# Patient Record
Sex: Male | Born: 1970 | Race: Black or African American | Hispanic: No | Marital: Married | Smoking: Current every day smoker
Health system: Southern US, Community
[De-identification: ages and names within clinical notes are randomized; demographics above are authoritative.]

---

## 2007-07-20 ENCOUNTER — Emergency Department: Payer: Self-pay | Admitting: Emergency Medicine

## 2016-03-25 ENCOUNTER — Emergency Department (HOSPITAL_COMMUNITY): Payer: Self-pay

## 2016-03-25 ENCOUNTER — Emergency Department (HOSPITAL_COMMUNITY)
Admission: EM | Admit: 2016-03-25 | Discharge: 2016-03-26 | Disposition: A | Discharge status: Home / Self Care | Payer: Self-pay | Source: Home / Self Care | Attending: Emergency Medicine | Admitting: Emergency Medicine

## 2016-03-25 ENCOUNTER — Emergency Department (HOSPITAL_COMMUNITY): Payer: Self-pay | Attending: Emergency Medicine

## 2016-03-25 ENCOUNTER — Encounter (HOSPITAL_COMMUNITY): Payer: Self-pay | Admitting: *Deleted

## 2016-03-25 DIAGNOSIS — Y929 Unspecified place or not applicable: Secondary | ICD-10-CM | POA: Insufficient documentation

## 2016-03-25 DIAGNOSIS — Y999 Unspecified external cause status: Secondary | ICD-10-CM

## 2016-03-25 DIAGNOSIS — T148XXA Other injury of unspecified body region, initial encounter: Secondary | ICD-10-CM

## 2016-03-25 DIAGNOSIS — R918 Other nonspecific abnormal finding of lung field: Secondary | ICD-10-CM

## 2016-03-25 DIAGNOSIS — S41012A Laceration without foreign body of left shoulder, initial encounter: Secondary | ICD-10-CM

## 2016-03-25 DIAGNOSIS — Z79899 Other long term (current) drug therapy: Secondary | ICD-10-CM

## 2016-03-25 DIAGNOSIS — Z5181 Encounter for therapeutic drug level monitoring: Secondary | ICD-10-CM | POA: Insufficient documentation

## 2016-03-25 DIAGNOSIS — Y939 Activity, unspecified: Secondary | ICD-10-CM | POA: Insufficient documentation

## 2016-03-25 LAB — I-STAT CHEM 8, ED
BUN: 9 mg/dL (ref 6–20)
CALCIUM ION: 1.08 mmol/L — AB (ref 1.15–1.40)
CHLORIDE: 105 mmol/L (ref 101–111)
CREATININE: 1.2 mg/dL (ref 0.61–1.24)
GLUCOSE: 92 mg/dL (ref 65–99)
HCT: 45 % (ref 39.0–52.0)
Hemoglobin: 15.3 g/dL (ref 13.0–17.0)
POTASSIUM: 4.1 mmol/L (ref 3.5–5.1)
Sodium: 138 mmol/L (ref 135–145)
TCO2: 20 mmol/L (ref 0–100)

## 2016-03-25 MED ORDER — FENTANYL CITRATE (PF) 100 MCG/2ML IJ SOLN
INTRAMUSCULAR | Status: AC
  Filled 2016-03-25: qty 2

## 2016-03-25 MED ORDER — IOPAMIDOL (ISOVUE-300) INJECTION 61%
75.0000 mL | Freq: Once | INTRAVENOUS | Status: AC | PRN
  Administered 2016-03-25: 75 mL via INTRAVENOUS

## 2016-03-25 MED ORDER — FENTANYL CITRATE (PF) 100 MCG/2ML IJ SOLN
INTRAMUSCULAR | Status: AC | PRN
  Administered 2016-03-25: 25 ug via INTRAVENOUS
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
## 2016-03-25 NOTE — ED Notes (Signed)
Patient transported to CT SCAN . 

## 2016-03-26 LAB — CBC WITH DIFFERENTIAL/PLATELET
BASOS ABS: 0 10*3/uL (ref 0.0–0.1)
Basophils Relative: 1 %
EOS ABS: 0.2 10*3/uL (ref 0.0–0.7)
Eosinophils Relative: 2 %
HCT: 42.3 % (ref 39.0–52.0)
HEMOGLOBIN: 14.6 g/dL (ref 13.0–17.0)
LYMPHS ABS: 3.4 10*3/uL (ref 0.7–4.0)
Lymphocytes Relative: 41 %
MCH: 31.3 pg (ref 26.0–34.0)
MCHC: 34.5 g/dL (ref 30.0–36.0)
MCV: 90.8 fL (ref 78.0–100.0)
Monocytes Absolute: 0.6 10*3/uL (ref 0.1–1.0)
Monocytes Relative: 8 %
NEUTROS PCT: 48 %
Neutro Abs: 4 10*3/uL (ref 1.7–7.7)
Platelets: 245 10*3/uL (ref 150–400)
RBC: 4.66 MIL/uL (ref 4.22–5.81)
RDW: 13.7 % (ref 11.5–15.5)
WBC: 8.2 10*3/uL (ref 4.0–10.5)

## 2016-03-26 LAB — PREPARE FRESH FROZEN PLASMA
UNIT DIVISION: 0
Unit division: 0

## 2016-03-26 LAB — PROTIME-INR
INR: 0.98
PROTHROMBIN TIME: 13 s (ref 11.4–15.2)

## 2016-03-26 LAB — TYPE AND SCREEN
ABO/RH(D): B POS
ANTIBODY SCREEN: NEGATIVE
UNIT DIVISION: 0
Unit division: 0

## 2016-03-26 LAB — BASIC METABOLIC PANEL
ANION GAP: 11 (ref 5–15)
BUN: 8 mg/dL (ref 6–20)
CHLORIDE: 104 mmol/L (ref 101–111)
CO2: 20 mmol/L — ABNORMAL LOW (ref 22–32)
Calcium: 9.1 mg/dL (ref 8.9–10.3)
Creatinine, Ser: 1.06 mg/dL (ref 0.61–1.24)
Glucose, Bld: 91 mg/dL (ref 65–99)
POTASSIUM: 4.1 mmol/L (ref 3.5–5.1)
SODIUM: 135 mmol/L (ref 135–145)

## 2016-03-26 LAB — ABO/RH: ABO/RH(D): B POS

## 2016-03-26 MED ORDER — LIDOCAINE-EPINEPHRINE (PF) 2 %-1:200000 IJ SOLN
20.0000 mL | Freq: Once | INTRAMUSCULAR | Status: AC
  Administered 2016-03-26: 20 mL
  Filled 2016-03-26: qty 20

## 2016-03-26 MED ORDER — FENTANYL CITRATE (PF) 100 MCG/2ML IJ SOLN
25.0000 ug | Freq: Once | INTRAMUSCULAR | Status: AC
Start: 2016-03-26 — End: 2016-03-26
  Administered 2016-03-26: 25 ug via INTRAVENOUS

## 2016-03-26 NOTE — Discharge Instructions (Signed)
Follow up for staple removal in 1 week. Return to the ED if you develop or worsening symptoms.

## 2016-03-26 NOTE — ED Notes (Signed)
Pt. ambulated without difficulty , no dizziness/respirations unlabored , pt. tolerated oral fluids ( Sprite) with no nausea or emesis .
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
## 2016-06-11 ENCOUNTER — Encounter: Payer: Self-pay | Admitting: Emergency Medicine

## 2016-06-11 ENCOUNTER — Emergency Department
Admission: EM | Admit: 2016-06-11 | Discharge: 2016-06-12 | Disposition: A | Discharge status: Home / Self Care | Payer: Self-pay | Attending: Student in an Organized Health Care Education/Training Program | Admitting: Student in an Organized Health Care Education/Training Program

## 2016-06-11 DIAGNOSIS — F17200 Nicotine dependence, unspecified, uncomplicated: Secondary | ICD-10-CM | POA: Insufficient documentation

## 2016-06-11 DIAGNOSIS — F1010 Alcohol abuse, uncomplicated: Secondary | ICD-10-CM

## 2016-06-11 DIAGNOSIS — R45851 Suicidal ideations: Secondary | ICD-10-CM

## 2016-06-11 DIAGNOSIS — F1994 Other psychoactive substance use, unspecified with psychoactive substance-induced mood disorder: Secondary | ICD-10-CM

## 2016-06-11 DIAGNOSIS — Z5181 Encounter for therapeutic drug level monitoring: Secondary | ICD-10-CM | POA: Insufficient documentation

## 2016-06-11 LAB — SALICYLATE LEVEL

## 2016-06-11 LAB — CBC
HEMATOCRIT: 46.7 % (ref 40.0–52.0)
HEMOGLOBIN: 16.3 g/dL (ref 13.0–18.0)
MCH: 32.3 pg (ref 26.0–34.0)
MCHC: 35 g/dL (ref 32.0–36.0)
MCV: 92.5 fL (ref 80.0–100.0)
Platelets: 265 10*3/uL (ref 150–440)
RBC: 5.04 MIL/uL (ref 4.40–5.90)
RDW: 14 % (ref 11.5–14.5)
WBC: 6.7 10*3/uL (ref 3.8–10.6)

## 2016-06-11 LAB — URINE DRUG SCREEN, QUALITATIVE (ARMC ONLY)
Amphetamines, Ur Screen: NOT DETECTED
BARBITURATES, UR SCREEN: NOT DETECTED
Benzodiazepine, Ur Scrn: NOT DETECTED
CANNABINOID 50 NG, UR ~~LOC~~: NOT DETECTED
COCAINE METABOLITE, UR ~~LOC~~: NOT DETECTED
MDMA (Ecstasy)Ur Screen: NOT DETECTED
Methadone Scn, Ur: NOT DETECTED
OPIATE, UR SCREEN: NOT DETECTED
Phencyclidine (PCP) Ur S: NOT DETECTED
Tricyclic, Ur Screen: NOT DETECTED

## 2016-06-11 LAB — COMPREHENSIVE METABOLIC PANEL
ALT: 34 U/L (ref 17–63)
AST: 37 U/L (ref 15–41)
Albumin: 4.7 g/dL (ref 3.5–5.0)
Alkaline Phosphatase: 64 U/L (ref 38–126)
Anion gap: 10 (ref 5–15)
BUN: 11 mg/dL (ref 6–20)
CALCIUM: 9.1 mg/dL (ref 8.9–10.3)
CO2: 22 mmol/L (ref 22–32)
CREATININE: 1.14 mg/dL (ref 0.61–1.24)
Chloride: 107 mmol/L (ref 101–111)
GFR calc non Af Amer: >60 mL/min (ref >60)
GLUCOSE: 95 mg/dL (ref 65–99)
Potassium: 4.3 mmol/L (ref 3.5–5.1)
SODIUM: 139 mmol/L (ref 135–145)
Total Bilirubin: 0.5 mg/dL (ref 0.3–1.2)
Total Protein: 8.6 g/dL — ABNORMAL HIGH (ref 6.5–8.1)

## 2016-06-11 LAB — ACETAMINOPHEN LEVEL: Acetaminophen (Tylenol), Serum: <10 ug/mL — ABNORMAL LOW (ref 10–30)

## 2016-06-11 LAB — ETHANOL: Alcohol, Ethyl (B): 240 mg/dL — ABNORMAL HIGH (ref <5)

## 2016-06-11 NOTE — ED Notes (Signed)
Pt informed on plan of care and pt becomes agitated, stating that he is not going to stay here and is not going to talk to anyone; attempted to calm pt and explain procedures but pt cont to refuses, stating that he is leaving; [place] speak with pt; charge nurse notified and officer informed IVC papers are now being taken out

## 2016-06-11 NOTE — ED Notes (Signed)
Pt calm now after speaking with officer; EDT, Kennedy Buckerhanh in room to dress pt into behav scrubs

## 2016-06-11 NOTE — ED Notes (Addendum)
Pt reports that he has been drinking heavy X 2 years since him and his wife split. PT reports he drinks beer daily and "drank too much tonight". Pt states that he made remarks about dying to his cousin, whom he is living with and that they called PD tonight. Pt denies wanting to die or having plan. Pt has 3 young children that he states he needs to be around for. Pt interested in detox from alcohol. Denies HI. Pt pleasant and cooperative.   TTS at bedside.

## 2016-06-11 NOTE — ED Triage Notes (Addendum)
Patient ambulatory to triage with steady gait, without difficulty or distress noted; pt with no eye contact; [facility]; pt reports depression and thoughts of hurting himself; denies hx of same; when asked if he has a plan, st "just gonna make it fast and get it over with"

## 2016-06-11 NOTE — ED Notes (Signed)
Report from Stephen, RN.

## 2016-06-12 DIAGNOSIS — F1994 Other psychoactive substance use, unspecified with psychoactive substance-induced mood disorder: Secondary | ICD-10-CM

## 2016-06-12 DIAGNOSIS — F1010 Alcohol abuse, uncomplicated: Secondary | ICD-10-CM

## 2016-06-12 NOTE — ED Provider Notes (Signed)
[facility] Emergency Department Provider Note    First MD Initiated Contact with Patient 06/11/16 2230     (approximate)  I have reviewed the triage vital signs and the nursing notes.   HISTORY  Chief Complaint Mental Health Problem    HPI Joshua Stevens is a 46 y.o. male presents via [employer] with etoh intoxication and thoughts of hurting himself.  Patient undergoing recent divorce and his new girlfriend reportedly has left him.  Denies hallucinations.  Admits to daily etoh abuse.  States he wants to die.  Denies formal plan.  No history of suicide attempts   History reviewed. No pertinent past medical history. FMH: no bleeding disorders History reviewed. No pertinent surgical history. There are no active problems to display for this patient.     Prior to Admission medications   Not on File    Allergies Patient has no known allergies.    Social History Social History  Substance Use Topics  . Smoking status: Current Every Day Smoker  . Smokeless tobacco: Never Used  . Alcohol use Yes    Review of Systems Patient denies headaches, rhinorrhea, blurry vision, numbness, shortness of breath, chest pain, edema, cough, abdominal pain, nausea, vomiting, diarrhea, dysuria, fevers, rashes or hallucinations unless otherwise stated above in HPI. ____________________________________________   PHYSICAL EXAM:  VITAL SIGNS: Vitals:   06/12/16 0154 06/12/16 0626  BP: 115/71 129/82  Pulse: 92 81  Resp: 16 16  Temp:  98.1 F (36.7 C)    Constitutional: Alert and in no acute distress. Eyes: Conjunctivae are normal. PERRL. EOMI. Head: Atraumatic. Nose: No congestion/rhinnorhea. Mouth/Throat: Mucous membranes are moist.  Oropharynx non-erythematous. Neck: No stridor. Painless ROM. No cervical spine tenderness to palpation Hematological/Lymphatic/Immunilogical: No cervical lymphadenopathy. Cardiovascular: Normal rate, regular rhythm. Grossly normal  heart sounds.  Good peripheral circulation. Respiratory: Normal respiratory effort.  No retractions. Lungs CTAB. Gastrointestinal: Soft and nontender. No distention. No abdominal bruits. No CVA tenderness. Genitourinary:  Musculoskeletal: No lower extremity tenderness nor edema.  No joint effusions. Neurologic:  Normal speech and language. No gross focal neurologic deficits are appreciated. No gait instability. Skin:  Skin is warm, dry and intact. No rash noted. Psychiatric: intoxicated, feels depressed, appears slightly manic ____________________________________________   LABS (all labs ordered are listed, but only abnormal results are displayed)  Results for orders placed or performed during the hospital encounter of 06/11/16 (from the past 24 hour(s))  Comprehensive metabolic panel     Status: Abnormal   Collection Time: 06/11/16 10:03 PM  Result Value Ref Range   Sodium 139 135 - 145 mmol/L   Potassium 4.3 3.5 - 5.1 mmol/L   Chloride 107 101 - 111 mmol/L   CO2 22 22 - 32 mmol/L   Glucose, Bld 95 65 - 99 mg/dL   BUN 11 6 - 20 mg/dL   Creatinine, Ser 1.611.14 0.61 - 1.24 mg/dL   Calcium 9.1 8.9 - 09.610.3 mg/dL   Total Protein 8.6 (H) 6.5 - 8.1 g/dL   Albumin 4.7 3.5 - 5.0 g/dL   AST 37 15 - 41 U/L   ALT 34 17 - 63 U/L   Alkaline Phosphatase 64 38 - 126 U/L   Total Bilirubin 0.5 0.3 - 1.2 mg/dL   GFR calc non Af Amer >60 >60 mL/min   GFR calc Af Amer >60 >60 mL/min   Anion gap 10 5 - 15  Ethanol     Status: Abnormal   Collection Time: 06/11/16 10:03 PM  Result  Value Ref Range   Alcohol, Ethyl (B) 240 (H) <5 mg/dL  Salicylate level     Status: None   Collection Time: 06/11/16 10:03 PM  Result Value Ref Range   Salicylate Lvl <7.0 2.8 - 30.0 mg/dL  Acetaminophen level     Status: Abnormal   Collection Time: 06/11/16 10:03 PM  Result Value Ref Range   Acetaminophen (Tylenol), Serum <10 (L) 10 - 30 ug/mL  cbc     Status: None   Collection Time: 06/11/16 10:03 PM  Result Value  Ref Range   WBC 6.7 3.8 - 10.6 K/uL   RBC 5.04 4.40 - 5.90 MIL/uL   Hemoglobin 16.3 13.0 - 18.0 g/dL   HCT 16.1 09.6 - 04.5 %   MCV 92.5 80.0 - 100.0 fL   MCH 32.3 26.0 - 34.0 pg   MCHC 35.0 32.0 - 36.0 g/dL   RDW 40.9 81.1 - 91.4 %   Platelets 265 150 - 440 K/uL  Urine Drug Screen, Qualitative     Status: None   Collection Time: 06/11/16 10:03 PM  Result Value Ref Range   Tricyclic, Ur Screen NONE DETECTED NONE DETECTED   Amphetamines, Ur Screen NONE DETECTED NONE DETECTED   MDMA (Ecstasy)Ur Screen NONE DETECTED NONE DETECTED   Cocaine Metabolite,Ur [place] NONE DETECTED NONE DETECTED   Opiate, Ur Screen NONE DETECTED NONE DETECTED   Phencyclidine (PCP) Ur S NONE DETECTED NONE DETECTED   Cannabinoid 50 Ng, Ur [place] NONE DETECTED NONE DETECTED   Barbiturates, Ur Screen NONE DETECTED NONE DETECTED   Benzodiazepine, Ur Scrn NONE DETECTED NONE DETECTED   Methadone Scn, Ur NONE DETECTED NONE DETECTED   ____________________________________________  EKG____________________________________________  RADIOLOGY   ____________________________________________   PROCEDURES  Procedure(s) performed:  Procedures    Critical Care performed: 1o ____________________________________________   INITIAL IMPRESSION / ASSESSMENT AND PLAN / ED COURSE  Pertinent labs & imaging results that were available during my care of the patient were reviewed by me and considered in my medical decision making (see chart for details).  DDX: Psychosis, delirium, medication effect, noncompliance, polysubstance abuse, Si, Hi, depression   Joshua Stevens is a 46 y.o. who presents to the ED with for evaluation of SI and etoh abuse.  Laboratory testing was ordered to evaluation for underlying electrolyte derangement or signs of underlying organic pathology to explain today's presentation.  Based on history and physical and laboratory evaluation, it appears that the patient's presentation is 2/2 underlying psychiatric  disorder and will require further evaluation and management by inpatient psychiatry.  Patient was  made an IVC due to SI and etoh abuse.  Disposition pending psychiatric evaluation.       ____________________________________________   FINAL CLINICAL IMPRESSION(S) / ED DIAGNOSES  Final diagnoses:  Alcohol abuse  Suicidal thoughts      NEW MEDICATIONS STARTED DURING THIS VISIT:  New Prescriptions   No medications on file     Note:  This document was prepared using Dragon voice recognition software and may include unintentional dictation errors.    Willy Eddy, MD 06/12/16 1023

## 2016-06-12 NOTE — ED Notes (Signed)

## 2016-06-12 NOTE — BH Assessment (Signed)
Assessment Note  Joshua Stevens is an 46 y.o. male presenting to the ED under involuntary commitment for suicidal ideations and alcohol intoxication.  Pt reports  he has been drinking heavy X 2 years since he and his wife split. PT reports he drinks beer daily and "drank too much tonight". He reports mading remarks about dying to his cousin, whom he is living with and that they called [place] [employer] tonight.   Pt denies SI while in the ED . He states he has 3 young children that he he needs to be around for them. Pt interested in detox from alcohol and participating in [place].  Diagnosis: Depression; Alcohol Use  Past Medical History: History reviewed. No pertinent past medical history.  History reviewed. No pertinent surgical history.  Family History: No family history on file.  Social History:  reports that he has been smoking.  He has never used smokeless tobacco. He reports that he drinks alcohol. He reports that he does not use drugs.  Additional Social History:  Alcohol / Drug Use Pain Medications: See PTA Prescriptions: See PTA Over the Counter: See PTA History of alcohol / drug use?: Yes Longest period of sobriety (when/how long): can't remember Negative Consequences of Use: Personal relationships Substance #1 Name of Substance 1: Alcohol 1 - Age of First Use: 44 1 - Amount (size/oz): varies 1 - Frequency: daily 1 - Duration: daily 1 - Last Use / Amount: 06/11/16  CIWA: CIWA-Ar BP: 130/84 Pulse Rate: 91 COWS:    Allergies: No Known Allergies  Home Medications:  (Not in a hospital admission)  OB/GYN Status:  No LMP for male patient.  General Assessment Data Location of Assessment: [facility] TTS Assessment: In system Is this a Tele or Face-to-Face Assessment?: Face-to-Face Is this an Initial Assessment or a Re-assessment for this encounter?: Initial Assessment Marital status: Divorced [place] name: n/a Is patient pregnant?: No Pregnancy Status: No Living Arrangements:  Other relatives Can pt return to current living arrangement?: Yes Admission Status: Involuntary Is patient capable of signing voluntary admission?: Yes Referral Source: Self/Family/Friend Insurance type: None  Medical Screening Exam [facility] Walk-in ONLY) Medical Exam completed: Yes  Crisis Care Plan Living Arrangements: Other relatives Legal Guardian: Other: (self) Name of Psychiatrist: none reported Name of Therapist: none reported  Education Status Is patient currently in school?: No Current Grade: na Highest grade of school patient has completed: 12 Name of school: na Contact person: na  Risk to self with the past 6 months Suicidal Ideation: No Has patient been a risk to self within the past 6 months prior to admission? : No Suicidal Intent: No Has patient had any suicidal intent within the past 6 months prior to admission? : No Is patient at risk for suicide?: No Suicidal Plan?: No Has patient had any suicidal plan within the past 6 months prior to admission? : No Access to Means: No What has been your use of drugs/alcohol within the last 12 months?: Beer on a daily basis; amount unknown Previous Attempts/Gestures: No How many times?: 0 Other Self Harm Risks: None identified Triggers for Past Attempts: None known Intentional Self Injurious Behavior: None Family Suicide History: No Recent stressful life event(s): Divorce Persecutory voices/beliefs?: No Depression: Yes Depression Symptoms: Despondent, Guilt, Loss of interest in usual pleasures, Feeling worthless/self pity, Feeling angry/irritable Substance abuse history and/or treatment for substance abuse?: Yes Suicide prevention information given to non-admitted patients: Not applicable  Risk to Others within the past 6 months Homicidal Ideation: No Does patient  have any lifetime risk of violence toward others beyond the six months prior to admission? : No Thoughts of Harm to Others: No Current Homicidal Intent:  No Current Homicidal Plan: No Access to Homicidal Means: No Identified Victim: one identified History of harm to others?: No Assessment of Violence: None Noted Violent Behavior Description: None identified Does patient have access to weapons?: No Criminal Charges Pending?: No Does patient have a court date: No Is patient on probation?: No  Psychosis Hallucinations: None noted Delusions: None noted  Mental Status Report Appearance/Hygiene: In scrubs Eye Contact: Good Motor Activity: Freedom of movement Speech: Logical/coherent Level of Consciousness: Alert Mood: Depressed Affect: Appropriate to circumstance, Depressed Anxiety Level: None Thought Processes: Relevant Judgement: Partial Orientation: Person, Place, Time, Situation Obsessive Compulsive Thoughts/Behaviors: None  Cognitive Functioning Concentration: Normal Memory: Recent Intact, Remote Intact IQ: Average Insight: Fair Impulse Control: Fair Appetite: Good Weight Loss: 0 Weight Gain: 0 Sleep: No Change Total Hours of Sleep: 8 Vegetative Symptoms: None  ADLScreening [facility] Assessment Services) Patient's cognitive ability adequate to safely complete daily activities?: Yes Patient able to express need for assistance with ADLs?: Yes Independently performs ADLs?: Yes (appropriate for developmental age)  Prior Inpatient Therapy Prior Inpatient Therapy: No Prior Therapy Dates: na Prior Therapy Facilty/Provider(s): na Reason for Treatment: na  Prior Outpatient Therapy Prior Outpatient Therapy: No Prior Therapy Dates: na Prior Therapy Facilty/Provider(s): na Reason for Treatment: na Does patient have an ACCT team?: No Does patient have Intensive In-House Services?  : No Does patient have [facility] services? : No Does patient have P4CC services?: No  ADL Screening (condition at time of admission) Patient's cognitive ability adequate to safely complete daily activities?: Yes Patient able to express need for  assistance with ADLs?: Yes Independently performs ADLs?: Yes (appropriate for developmental age)       Abuse/Neglect Assessment (Assessment to be complete while patient is alone) Physical Abuse: Denies Verbal Abuse: Denies Sexual Abuse: Denies Exploitation of patient/patient's resources: Denies Self-Neglect: Denies Values / Beliefs Cultural Requests During Hospitalization: None Spiritual Requests During Hospitalization: None Consults Spiritual Care Consult Needed: No Social Work Consult Needed: No Merchant navy officerAdvance Directives (For Healthcare) Does Patient Have a Medical Advance Directive?: No Would patient like information on creating a medical advance directive?: No - Patient declined    Additional Information 1:1 In Past 12 Months?: No CIRT Risk: No Elopement Risk: No Does patient have medical clearance?: Yes     Disposition:  Disposition Initial Assessment Completed for this Encounter: Yes Disposition of Patient: Other dispositions Other disposition(s): Other (Comment) (Pending Psych MD consult)  On Site Evaluation by:   Reviewed with Physician:    Manus Ruddoxana C Kaidyn Javid 06/12/2016 1:44 AM

## 2016-06-12 NOTE — ED Notes (Addendum)
Report to Selena BattenKim, RN   [facility] Miguel DibbleEd Tech and ODS officer. Pt belongings [facility] with Ed Tech [facility]

## 2016-06-12 NOTE — ED Provider Notes (Signed)
-----------------------------------------   7:14 AM on 06/12/2016 -----------------------------------------   Blood pressure 129/82, pulse 81, temperature 98.1 F (36.7 C), temperature source Oral, resp. rate 16, height 5\' 7"  (1.702 m), weight 160 lb (72.6 kg), SpO2 98 %.  The patient had no acute events since last update.  Calm and cooperative at this time.  Disposition is pending Psychiatry/Behavioral Medicine team recommendations.     Irean HongJade J Sung, MD 06/12/16 [phone]

## 2016-06-12 NOTE — ED Notes (Addendum)
Meal brought to patient. Patient reports that he doesn't eat pork, but states that he "isn't hungry anyway". Writer called dietary to put in order for another  breakfast without pork. Pt reports wanting to sleep more.

## 2016-06-12 NOTE — ED Notes (Signed)
Pt. To BHU from ED ambulatory without difficulty, to room  BHU 4. [facility]. Pt. Is alert and oriented, warm and dry in no distress. Pt. Denies SI, HI, and AVH. Pt. Calm and cooperative. Pt. Made aware of security cameras and Q15 minute rounds. Pt. Encouraged to let Nursing staff know of any concerns or needs.
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
## 2016-06-12 NOTE — ED Notes (Signed)
ED BHU PLACEMENT JUSTIFICATION Is the patient under IVC or is there intent for IVC: Yes.   Is the patient medically cleared: Yes.   Is there vacancy in the ED BHU: Yes.   Is the population mix appropriate for patient: Yes.   Is the patient awaiting placement in inpatient or outpatient setting: No. Has the patient had a psychiatric consult: No. Survey of unit performed for contraband, proper placement and condition of furniture, tampering with fixtures in bathroom, shower, and each patient room: Yes.  ; Findings: NA APPEARANCE/BEHAVIOR calm, cooperative and adequate rapport can be established NEURO ASSESSMENT Orientation: time, place and person Hallucinations: No.None noted (Hallucinations) Speech: Normal Gait: normal RESPIRATORY ASSESSMENT Normal expansion.  Clear to auscultation.  No rales, rhonchi, or wheezing. CARDIOVASCULAR ASSESSMENT regular rate and rhythm, S1, S2 normal, no murmur, click, rub or gallop GASTROINTESTINAL ASSESSMENT soft, nontender, BS WNL, no r/g EXTREMITIES WDL PLAN OF CARE Provide calm/safe environment. Vital signs assessed twice daily. ED BHU Assessment once each 12-hour shift. Collaborate with intake RN daily or as condition indicates. Assure the ED provider has rounded once each shift. Provide and encourage hygiene. Provide redirection as needed. Assess for escalating behavior; address immediately and inform ED provider.  Assess family dynamic and appropriateness for visitation as needed: Yes.  ; If necessary, describe findings: NA Educate the patient/family about BHU procedures/visitation: Yes.  ; If necessary, describe findings: NA

## 2016-06-12 NOTE — ED Notes (Signed)
Meal brought to patient 

## 2016-06-12 NOTE — ED Notes (Signed)
Pt discharged to lobby. Pt was stable and appreciative at that time. All papers  were given and valuables returned. Verbal understanding expressed. Denies SI/HI and A/VH. Pt given opportunity to express concerns and ask questions. °

## 2016-06-12 NOTE — ED Notes (Signed)
Wife here to see patient 

## 2016-06-14 ENCOUNTER — Encounter: Payer: Self-pay | Admitting: Emergency Medicine

## 2019-08-27 ENCOUNTER — Encounter (HOSPITAL_COMMUNITY): Payer: Self-pay | Admitting: *Deleted

## 2019-08-27 ENCOUNTER — Other Ambulatory Visit: Payer: Self-pay

## 2019-08-27 ENCOUNTER — Emergency Department (HOSPITAL_COMMUNITY): Payer: No Typology Code available for payment source

## 2019-08-27 ENCOUNTER — Emergency Department (HOSPITAL_COMMUNITY)
Admission: EM | Admit: 2019-08-27 | Discharge: 2019-08-28 | Disposition: A | Discharge status: Home / Self Care | Payer: No Typology Code available for payment source | Attending: Emergency Medicine | Admitting: Emergency Medicine

## 2019-08-27 DIAGNOSIS — S01512A Laceration without foreign body of oral cavity, initial encounter: Secondary | ICD-10-CM | POA: Insufficient documentation

## 2019-08-27 DIAGNOSIS — Z79899 Other long term (current) drug therapy: Secondary | ICD-10-CM | POA: Insufficient documentation

## 2019-08-27 DIAGNOSIS — S0990XA Unspecified injury of head, initial encounter: Secondary | ICD-10-CM | POA: Diagnosis not present

## 2019-08-27 DIAGNOSIS — Y92410 Unspecified street and highway as the place of occurrence of the external cause: Secondary | ICD-10-CM | POA: Diagnosis not present

## 2019-08-27 DIAGNOSIS — S20212A Contusion of left front wall of thorax, initial encounter: Secondary | ICD-10-CM | POA: Diagnosis not present

## 2019-08-27 DIAGNOSIS — F10929 Alcohol use, unspecified with intoxication, unspecified: Secondary | ICD-10-CM | POA: Diagnosis not present

## 2019-08-27 DIAGNOSIS — S20219A Contusion of unspecified front wall of thorax, initial encounter: Secondary | ICD-10-CM

## 2019-08-27 DIAGNOSIS — Y93I9 Activity, other involving external motion: Secondary | ICD-10-CM | POA: Insufficient documentation

## 2019-08-27 DIAGNOSIS — S2090XA Unspecified superficial injury of unspecified parts of thorax, initial encounter: Secondary | ICD-10-CM | POA: Diagnosis present

## 2019-08-27 DIAGNOSIS — Y908 Blood alcohol level of 240 mg/100 ml or more: Secondary | ICD-10-CM | POA: Insufficient documentation

## 2019-08-27 DIAGNOSIS — F17210 Nicotine dependence, cigarettes, uncomplicated: Secondary | ICD-10-CM | POA: Insufficient documentation

## 2019-08-27 DIAGNOSIS — Y999 Unspecified external cause status: Secondary | ICD-10-CM | POA: Insufficient documentation

## 2019-08-27 LAB — CBC WITH DIFFERENTIAL/PLATELET
Abs Immature Granulocytes: 0.05 10*3/uL (ref 0.00–0.07)
Basophils Absolute: 0.1 10*3/uL (ref 0.0–0.1)
Basophils Relative: 1 %
Eosinophils Absolute: 0 10*3/uL (ref 0.0–0.5)
Eosinophils Relative: 0 %
HCT: 47.9 % (ref 39.0–52.0)
Hemoglobin: 16 g/dL (ref 13.0–17.0)
Immature Granulocytes: 1 %
Lymphocytes Relative: 27 %
Lymphs Abs: 2.1 10*3/uL (ref 0.7–4.0)
MCH: 31.6 pg (ref 26.0–34.0)
MCHC: 33.4 g/dL (ref 30.0–36.0)
MCV: 94.7 fL (ref 80.0–100.0)
Monocytes Absolute: 0.3 10*3/uL (ref 0.1–1.0)
Monocytes Relative: 4 %
Neutro Abs: 5.2 10*3/uL (ref 1.7–7.7)
Neutrophils Relative %: 67 %
Platelets: 266 10*3/uL (ref 150–400)
RBC: 5.06 MIL/uL (ref 4.22–5.81)
RDW: 14.2 % (ref 11.5–15.5)
WBC: 7.8 10*3/uL (ref 4.0–10.5)
nRBC: 0 % (ref 0.0–0.2)

## 2019-08-27 LAB — BASIC METABOLIC PANEL
Anion gap: 11 (ref 5–15)
BUN: 8 mg/dL (ref 6–20)
CO2: 23 mmol/L (ref 22–32)
Calcium: 9.1 mg/dL (ref 8.9–10.3)
Chloride: 109 mmol/L (ref 98–111)
Creatinine, Ser: 0.92 mg/dL (ref 0.61–1.24)
GFR calc Af Amer: >60 mL/min (ref >60)
GFR calc non Af Amer: >60 mL/min (ref >60)
Glucose, Bld: 88 mg/dL (ref 70–99)
Potassium: 4 mmol/L (ref 3.5–5.1)
Sodium: 143 mmol/L (ref 135–145)

## 2019-08-27 LAB — ETHANOL: Alcohol, Ethyl (B): 261 mg/dL — ABNORMAL HIGH (ref <10)
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
## 2019-08-28 MED ORDER — AMOXICILLIN 500 MG PO CAPS: 500.0000 mg | ORAL_CAPSULE | Freq: Three times a day (TID) | ORAL | 0 refills | Status: AC

## 2019-08-28 NOTE — ED Notes (Signed)
Pt concerned about laceration to inside of mouth, MD made aware.

## 2019-08-28 NOTE — ED Provider Notes (Signed)
Patient was signed out to me by Dr. Renaye Rakers to follow.  Upon recheck he is sitting up in bed eating [employer] cheeseburgers.  Repeat evaluation reveals no abdominal tenderness.  He is breathing comfortably.  Scans and x-rays unremarkable.  He is asking to be discharged.  His significant other is here and willing to take him home.  I ordinarily would like to have watched him longer, but as he does not want to stay in the department any longer, will discharge with careful follow-up instructions.  Patient is concerned about a mouth injury.  Examination reveals a laceration of the intraoral mucosa of the lower lip on the left side.  This is consistent with a tooth injury.  It is not through and through.  Patient counseled on frequent mouth rinses with half peroxide/half water and will initiate antibiotic coverage.  He was given return precautions.   Gilda Crease, MD 08/28/19 [phone]
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
## 2022-06-10 ENCOUNTER — Encounter: Payer: Self-pay | Admitting: *Deleted

## 2022-11-10 ENCOUNTER — Encounter: Payer: Self-pay | Admitting: *Deleted
